# Patient Record
Sex: Male | Born: 1980 | Race: White | Hispanic: No | Marital: Married | Smoking: Never smoker
Health system: Southern US, Community
[De-identification: ages and names within clinical notes are randomized; demographics above are authoritative.]

## PROBLEM LIST (undated history)

## (undated) HISTORY — PX: WISDOM TOOTH EXTRACTION: SHX21

---

## 2013-02-08 ENCOUNTER — Ambulatory Visit (INDEPENDENT_AMBULATORY_CARE_PROVIDER_SITE_OTHER): Payer: Federal, State, Local not specified - PPO | Admitting: Family Medicine

## 2013-02-08 DIAGNOSIS — Z23 Encounter for immunization: Secondary | ICD-10-CM

## 2014-12-10 ENCOUNTER — Encounter (HOSPITAL_COMMUNITY): Payer: Self-pay | Admitting: Emergency Medicine

## 2014-12-10 ENCOUNTER — Observation Stay (HOSPITAL_COMMUNITY): Payer: Managed Care, Other (non HMO) | Admitting: Anesthesiology

## 2014-12-10 ENCOUNTER — Observation Stay (HOSPITAL_COMMUNITY)
Admission: EM | Admit: 2014-12-10 | Discharge: 2014-12-11 | Disposition: A | Discharge status: Home / Self Care | Payer: Managed Care, Other (non HMO) | Attending: General Surgery | Admitting: General Surgery

## 2014-12-10 ENCOUNTER — Encounter (HOSPITAL_COMMUNITY)
Admission: EM | Disposition: A | Discharge status: Home / Self Care | Payer: Self-pay | Source: Home / Self Care | Attending: Emergency Medicine

## 2014-12-10 ENCOUNTER — Emergency Department (HOSPITAL_COMMUNITY): Payer: Managed Care, Other (non HMO)

## 2014-12-10 DIAGNOSIS — K3580 Unspecified acute appendicitis: Principal | ICD-10-CM | POA: Insufficient documentation

## 2014-12-10 DIAGNOSIS — R1031 Right lower quadrant pain: Secondary | ICD-10-CM | POA: Diagnosis present

## 2014-12-10 DIAGNOSIS — K37 Unspecified appendicitis: Secondary | ICD-10-CM | POA: Diagnosis present

## 2014-12-10 HISTORY — PX: LAPAROSCOPIC APPENDECTOMY: SHX408

## 2014-12-10 LAB — URINALYSIS, ROUTINE W REFLEX MICROSCOPIC
Bilirubin Urine: NEGATIVE
GLUCOSE, UA: NEGATIVE mg/dL
Hgb urine dipstick: NEGATIVE
Ketones, ur: NEGATIVE mg/dL
LEUKOCYTES UA: NEGATIVE
Nitrite: NEGATIVE
PH: 6.5 (ref 5.0–8.0)
Protein, ur: NEGATIVE mg/dL
Specific Gravity, Urine: 1.02 (ref 1.005–1.030)
Urobilinogen, UA: 0.2 mg/dL (ref 0.0–1.0)

## 2014-12-10 LAB — COMPREHENSIVE METABOLIC PANEL
ALT: 38 U/L (ref 17–63)
ANION GAP: 13 (ref 5–15)
AST: 33 U/L (ref 15–41)
Albumin: 4.8 g/dL (ref 3.5–5.0)
Alkaline Phosphatase: 56 U/L (ref 38–126)
BUN: 16 mg/dL (ref 6–20)
CHLORIDE: 101 mmol/L (ref 101–111)
CO2: 24 mmol/L (ref 22–32)
Calcium: 9.8 mg/dL (ref 8.9–10.3)
Creatinine, Ser: 0.92 mg/dL (ref 0.61–1.24)
GFR calc non Af Amer: >60 mL/min (ref >60)
Glucose, Bld: 141 mg/dL — ABNORMAL HIGH (ref 65–99)
Potassium: 3.2 mmol/L — ABNORMAL LOW (ref 3.5–5.1)
SODIUM: 138 mmol/L (ref 135–145)
Total Bilirubin: 0.9 mg/dL (ref 0.3–1.2)
Total Protein: 7.9 g/dL (ref 6.5–8.1)

## 2014-12-10 LAB — CBC
HCT: 43.3 % (ref 39.0–52.0)
Hemoglobin: 15 g/dL (ref 13.0–17.0)
MCH: 30.3 pg (ref 26.0–34.0)
MCHC: 34.6 g/dL (ref 30.0–36.0)
MCV: 87.5 fL (ref 78.0–100.0)
Platelets: 217 K/uL (ref 150–400)
RBC: 4.95 MIL/uL (ref 4.22–5.81)
RDW: 12.1 % (ref 11.5–15.5)
WBC: 13.2 K/uL — ABNORMAL HIGH (ref 4.0–10.5)

## 2014-12-10 LAB — LIPASE, BLOOD: LIPASE: 18 U/L — AB (ref 22–51)

## 2014-12-10 SURGERY — APPENDECTOMY, LAPAROSCOPIC
Anesthesia: General | Site: Abdomen

## 2014-12-10 MED ORDER — MORPHINE SULFATE (PF) 4 MG/ML IV SOLN
4.0000 mg | Freq: Once | INTRAVENOUS | Status: AC
  Administered 2014-12-10: 4 mg via INTRAVENOUS
  Filled 2014-12-10: qty 1

## 2014-12-10 MED ORDER — ONDANSETRON HCL 4 MG/2ML IJ SOLN
INTRAMUSCULAR | Status: AC
  Filled 2014-12-10: qty 2

## 2014-12-10 MED ORDER — POTASSIUM CHLORIDE IN NACL 20-0.9 MEQ/L-% IV SOLN
INTRAVENOUS | Status: DC
  Administered 2014-12-10: 150 mL/h via INTRAVENOUS
  Filled 2014-12-10 (×3): qty 1000

## 2014-12-10 MED ORDER — IOHEXOL 300 MG/ML  SOLN
100.0000 mL | Freq: Once | INTRAMUSCULAR | Status: AC | PRN
  Administered 2014-12-10: 100 mL via INTRAVENOUS

## 2014-12-10 MED ORDER — HYDROCODONE-ACETAMINOPHEN 5-325 MG PO TABS
1.0000 | ORAL_TABLET | ORAL | Status: DC | PRN
  Administered 2014-12-10: 1 via ORAL
  Filled 2014-12-10: qty 1

## 2014-12-10 MED ORDER — FENTANYL CITRATE (PF) 100 MCG/2ML IJ SOLN
INTRAMUSCULAR | Status: DC | PRN
  Administered 2014-12-10: 100 ug via INTRAVENOUS
  Administered 2014-12-10 (×3): 50 ug via INTRAVENOUS

## 2014-12-10 MED ORDER — SODIUM CHLORIDE 0.9 % IV SOLN: INTRAVENOUS | Status: DC

## 2014-12-10 MED ORDER — FENTANYL CITRATE (PF) 100 MCG/2ML IJ SOLN: 25.0000 ug | INTRAMUSCULAR | Status: DC | PRN

## 2014-12-10 MED ORDER — DIPHENHYDRAMINE HCL 50 MG/ML IJ SOLN: 25.0000 mg | Freq: Four times a day (QID) | INTRAMUSCULAR | Status: DC | PRN

## 2014-12-10 MED ORDER — PROPOFOL 10 MG/ML IV BOLUS
INTRAVENOUS | Status: AC
  Filled 2014-12-10: qty 20

## 2014-12-10 MED ORDER — IOHEXOL 300 MG/ML  SOLN
50.0000 mL | Freq: Once | INTRAMUSCULAR | Status: AC | PRN
  Administered 2014-12-10: 50 mL via ORAL

## 2014-12-10 MED ORDER — METRONIDAZOLE IN NACL 5-0.79 MG/ML-% IV SOLN
500.0000 mg | Freq: Three times a day (TID) | INTRAVENOUS | Status: DC
  Administered 2014-12-10 – 2014-12-11 (×3): 500 mg via INTRAVENOUS
  Filled 2014-12-10 (×4): qty 100

## 2014-12-10 MED ORDER — ROCURONIUM BROMIDE 100 MG/10ML IV SOLN
INTRAVENOUS | Status: AC
  Filled 2014-12-10: qty 1

## 2014-12-10 MED ORDER — DIPHENHYDRAMINE HCL 25 MG PO CAPS
25.0000 mg | ORAL_CAPSULE | Freq: Four times a day (QID) | ORAL | Status: DC | PRN
Start: 2014-12-10 — End: 2014-12-11

## 2014-12-10 MED ORDER — PROMETHAZINE HCL 25 MG/ML IJ SOLN
6.2500 mg | INTRAMUSCULAR | Status: AC | PRN
  Administered 2014-12-10 (×2): 6.25 mg via INTRAVENOUS

## 2014-12-10 MED ORDER — ONDANSETRON HCL 4 MG/2ML IJ SOLN: 4.0000 mg | Freq: Once | INTRAMUSCULAR | Status: DC | PRN

## 2014-12-10 MED ORDER — BUPIVACAINE-EPINEPHRINE (PF) 0.5% -1:200000 IJ SOLN
INTRAMUSCULAR | Status: AC
  Filled 2014-12-10: qty 30

## 2014-12-10 MED ORDER — MIDAZOLAM HCL 2 MG/2ML IJ SOLN
INTRAMUSCULAR | Status: AC
  Filled 2014-12-10: qty 2

## 2014-12-10 MED ORDER — MORPHINE SULFATE (PF) 2 MG/ML IV SOLN: 1.0000 mg | INTRAVENOUS | Status: DC | PRN

## 2014-12-10 MED ORDER — PROPOFOL 10 MG/ML IV BOLUS
INTRAVENOUS | Status: DC | PRN
  Administered 2014-12-10: 200 mg via INTRAVENOUS

## 2014-12-10 MED ORDER — HYDRALAZINE HCL 20 MG/ML IJ SOLN: 10.0000 mg | INTRAMUSCULAR | Status: DC | PRN

## 2014-12-10 MED ORDER — DEXTROSE 5 % IV SOLN
2.0000 g | INTRAVENOUS | Status: DC
  Administered 2014-12-10 – 2014-12-11 (×2): 2 g via INTRAVENOUS
  Filled 2014-12-10 (×2): qty 2

## 2014-12-10 MED ORDER — DEXAMETHASONE SODIUM PHOSPHATE 10 MG/ML IJ SOLN
INTRAMUSCULAR | Status: DC | PRN
  Administered 2014-12-10: 10 mg via INTRAVENOUS

## 2014-12-10 MED ORDER — HYDROCODONE-ACETAMINOPHEN 5-325 MG PO TABS: 1.0000 | ORAL_TABLET | ORAL | Status: DC | PRN

## 2014-12-10 MED ORDER — LACTATED RINGERS IV SOLN
INTRAVENOUS | Status: DC
  Administered 2014-12-10: 1000 mL via INTRAVENOUS
  Administered 2014-12-10: 15:00:00 via INTRAVENOUS

## 2014-12-10 MED ORDER — ONDANSETRON 4 MG PO TBDP: 4.0000 mg | ORAL_TABLET | Freq: Four times a day (QID) | ORAL | Status: DC | PRN

## 2014-12-10 MED ORDER — DOCUSATE SODIUM 100 MG PO CAPS: 100.0000 mg | ORAL_CAPSULE | Freq: Two times a day (BID) | ORAL | Status: DC | PRN

## 2014-12-10 MED ORDER — ONDANSETRON HCL 4 MG/2ML IJ SOLN: 4.0000 mg | Freq: Four times a day (QID) | INTRAMUSCULAR | Status: DC | PRN

## 2014-12-10 MED ORDER — LACTATED RINGERS IR SOLN
Status: DC | PRN
  Administered 2014-12-10: 1

## 2014-12-10 MED ORDER — FENTANYL CITRATE (PF) 250 MCG/5ML IJ SOLN
INTRAMUSCULAR | Status: AC
  Filled 2014-12-10: qty 25

## 2014-12-10 MED ORDER — 0.9 % SODIUM CHLORIDE (POUR BTL) OPTIME
TOPICAL | Status: DC | PRN
  Administered 2014-12-10: 1000 mL

## 2014-12-10 MED ORDER — MIDAZOLAM HCL 5 MG/5ML IJ SOLN
INTRAMUSCULAR | Status: DC | PRN
  Administered 2014-12-10: 2 mg via INTRAVENOUS

## 2014-12-10 MED ORDER — ONDANSETRON HCL 4 MG/2ML IJ SOLN
4.0000 mg | Freq: Once | INTRAMUSCULAR | Status: AC
  Administered 2014-12-10: 4 mg via INTRAVENOUS
  Filled 2014-12-10: qty 2

## 2014-12-10 MED ORDER — MORPHINE SULFATE (PF) 2 MG/ML IV SOLN
1.0000 mg | INTRAVENOUS | Status: DC | PRN
  Administered 2014-12-10: 4 mg via INTRAVENOUS
  Filled 2014-12-10: qty 2

## 2014-12-10 MED ORDER — ROCURONIUM BROMIDE 100 MG/10ML IV SOLN
INTRAVENOUS | Status: DC | PRN
  Administered 2014-12-10: 30 mg via INTRAVENOUS

## 2014-12-10 MED ORDER — SODIUM CHLORIDE 0.9 % IJ SOLN
INTRAMUSCULAR | Status: AC
  Filled 2014-12-10: qty 10

## 2014-12-10 MED ORDER — SIMETHICONE 80 MG PO CHEW
40.0000 mg | CHEWABLE_TABLET | Freq: Four times a day (QID) | ORAL | Status: DC | PRN
  Filled 2014-12-10: qty 1

## 2014-12-10 MED ORDER — FAMOTIDINE 10 MG PO TABS
10.0000 mg | ORAL_TABLET | Freq: Once | ORAL | Status: AC
  Administered 2014-12-10: 10 mg via ORAL
  Filled 2014-12-10: qty 1

## 2014-12-10 MED ORDER — BUPIVACAINE-EPINEPHRINE 0.5% -1:200000 IJ SOLN
INTRAMUSCULAR | Status: DC | PRN
  Administered 2014-12-10: 21 mL

## 2014-12-10 MED ORDER — POTASSIUM CHLORIDE IN NACL 20-0.9 MEQ/L-% IV SOLN
INTRAVENOUS | Status: DC
  Administered 2014-12-10: 18:00:00 via INTRAVENOUS
  Filled 2014-12-10 (×3): qty 1000

## 2014-12-10 MED ORDER — ENOXAPARIN SODIUM 40 MG/0.4ML ~~LOC~~ SOLN
40.0000 mg | SUBCUTANEOUS | Status: DC
  Filled 2014-12-10 (×2): qty 0.4

## 2014-12-10 MED ORDER — ACETAMINOPHEN 325 MG PO TABS: 650.0000 mg | ORAL_TABLET | Freq: Four times a day (QID) | ORAL | Status: DC | PRN

## 2014-12-10 MED ORDER — GLYCOPYRROLATE 0.2 MG/ML IJ SOLN
INTRAMUSCULAR | Status: DC | PRN
  Administered 2014-12-10: 0.4 mg via INTRAVENOUS

## 2014-12-10 MED ORDER — LIDOCAINE HCL (CARDIAC) 20 MG/ML IV SOLN
INTRAVENOUS | Status: DC | PRN
  Administered 2014-12-10: 100 mg via INTRAVENOUS

## 2014-12-10 MED ORDER — LIDOCAINE HCL (CARDIAC) 20 MG/ML IV SOLN
INTRAVENOUS | Status: AC
  Filled 2014-12-10: qty 5

## 2014-12-10 MED ORDER — NEOSTIGMINE METHYLSULFATE 10 MG/10ML IV SOLN
INTRAVENOUS | Status: DC | PRN
  Administered 2014-12-10: 3.5 mg via INTRAVENOUS

## 2014-12-10 MED ORDER — GLYCOPYRROLATE 0.2 MG/ML IJ SOLN
INTRAMUSCULAR | Status: AC
  Filled 2014-12-10: qty 2

## 2014-12-10 MED ORDER — ONDANSETRON HCL 4 MG/2ML IJ SOLN
INTRAMUSCULAR | Status: DC | PRN
  Administered 2014-12-10: 4 mg via INTRAVENOUS

## 2014-12-10 MED ORDER — ACETAMINOPHEN 650 MG RE SUPP: 650.0000 mg | Freq: Four times a day (QID) | RECTAL | Status: DC | PRN

## 2014-12-10 MED ORDER — SODIUM CHLORIDE 0.9 % IV BOLUS (SEPSIS)
1000.0000 mL | Freq: Once | INTRAVENOUS | Status: AC
  Administered 2014-12-10: 1000 mL via INTRAVENOUS

## 2014-12-10 MED ORDER — PROMETHAZINE HCL 25 MG/ML IJ SOLN
INTRAMUSCULAR | Status: AC
  Administered 2014-12-10: 6.25 mg via INTRAVENOUS
  Filled 2014-12-10: qty 1

## 2014-12-10 MED ORDER — SUCCINYLCHOLINE CHLORIDE 20 MG/ML IJ SOLN
INTRAMUSCULAR | Status: DC | PRN
  Administered 2014-12-10: 100 mg via INTRAVENOUS

## 2014-12-10 SURGICAL SUPPLY — 42 items
APPLIER CLIP ROT 10 11.4 M/L (STAPLE)
APR CLP MED LRG 11.4X10 (STAPLE)
BAG SPEC RTRVL LRG 6X4 10 (ENDOMECHANICALS) ×1
CABLE HIGH FREQUENCY MONO STRZ (ELECTRODE) IMPLANT
CHLORAPREP W/TINT 26ML (MISCELLANEOUS) ×3 IMPLANT
CLIP APPLIE ROT 10 11.4 M/L (STAPLE) IMPLANT
CLOSURE WOUND 1/2 X4 (GAUZE/BANDAGES/DRESSINGS)
COVER SURGICAL LIGHT HANDLE (MISCELLANEOUS) ×3 IMPLANT
CUTTER FLEX LINEAR 45M (STAPLE) ×2 IMPLANT
DECANTER SPIKE VIAL GLASS SM (MISCELLANEOUS) ×3 IMPLANT
DEVICE TROCAR PUNCTURE CLOSURE (ENDOMECHANICALS) IMPLANT
DRAPE LAPAROSCOPIC ABDOMINAL (DRAPES) ×3 IMPLANT
ELECT REM PT RETURN 9FT ADLT (ELECTROSURGICAL) ×3
ELECTRODE REM PT RTRN 9FT ADLT (ELECTROSURGICAL) ×1 IMPLANT
ENDOLOOP SUT PDS II  0 18 (SUTURE)
ENDOLOOP SUT PDS II 0 18 (SUTURE) IMPLANT
GAUZE SPONGE 2X2 8PLY STRL LF (GAUZE/BANDAGES/DRESSINGS) ×1 IMPLANT
GLOVE EUDERMIC 7 POWDERFREE (GLOVE) ×3 IMPLANT
GOWN STRL REUS W/TWL XL LVL3 (GOWN DISPOSABLE) ×6 IMPLANT
KIT BASIN OR (CUSTOM PROCEDURE TRAY) ×3 IMPLANT
LIQUID BAND (GAUZE/BANDAGES/DRESSINGS) ×3 IMPLANT
POUCH SPECIMEN RETRIEVAL 10MM (ENDOMECHANICALS) ×3 IMPLANT
RELOAD 45 VASCULAR/THIN (ENDOMECHANICALS) ×3 IMPLANT
RELOAD STAPLE 45 2.5 WHT GRN (ENDOMECHANICALS) IMPLANT
RELOAD STAPLE 45 3.5 BLU ETS (ENDOMECHANICALS) IMPLANT
RELOAD STAPLE TA45 3.5 REG BLU (ENDOMECHANICALS) IMPLANT
SET IRRIG TUBING LAPAROSCOPIC (IRRIGATION / IRRIGATOR) ×3 IMPLANT
SHEARS HARMONIC ACE PLUS 36CM (ENDOMECHANICALS) IMPLANT
SOLUTION ANTI FOG 6CC (MISCELLANEOUS) ×3 IMPLANT
SPONGE GAUZE 2X2 STER 10/PKG (GAUZE/BANDAGES/DRESSINGS) ×2
STRIP CLOSURE SKIN 1/2X4 (GAUZE/BANDAGES/DRESSINGS) IMPLANT
SUT MNCRL AB 4-0 PS2 18 (SUTURE) ×3 IMPLANT
SUT VICRYL 0 UR6 27IN ABS (SUTURE) IMPLANT
TOWEL OR 17X26 10 PK STRL BLUE (TOWEL DISPOSABLE) ×3 IMPLANT
TOWEL OR NON WOVEN STRL DISP B (DISPOSABLE) ×3 IMPLANT
TRAY FOLEY W/METER SILVER 14FR (SET/KITS/TRAYS/PACK) ×3 IMPLANT
TRAY FOLEY W/METER SILVER 16FR (SET/KITS/TRAYS/PACK) ×3 IMPLANT
TRAY LAPAROSCOPIC (CUSTOM PROCEDURE TRAY) ×3 IMPLANT
TROCAR BLADELESS OPT 5 100 (ENDOMECHANICALS) ×3 IMPLANT
TROCAR XCEL 12X100 BLDLESS (ENDOMECHANICALS) ×3 IMPLANT
TROCAR XCEL BLUNT TIP 100MML (ENDOMECHANICALS) ×3 IMPLANT
TUBING INSUFFLATION 10FT LAP (TUBING) ×3 IMPLANT

## 2014-12-10 NOTE — ED Provider Notes (Signed)
CSN: [id number]     Arrival date & time 12/10/14  0147 History  This chart was scribed for Loren Racer, MD by Freida Busman, ED Scribe. This patient was seen in room WA18/WA18 and the patient's care was started 2:05 AM.    Chief Complaint  Patient presents with  . Abdominal Pain  . Nausea   The history is provided by the patient. No language interpreter was used.     HPI Comments:  Johnny Stephenson is a 34 y.o. male who presents to the Emergency Department complaining of gradual onset  mid to RLQ abdominal pain that started ~1900 last night.  He notes the pain worsened ~ 0100 this am. He reports associated nausea and chills. He denies diarrhea, vomiting, fever, dysuria, hematuria and h/o abdominal surgeries. No alleviating factors noted.  Pt last drank ~0100 this am.    History reviewed. No pertinent past medical history. Past Surgical History  Procedure Laterality Date  . Wisdom tooth extraction     History reviewed. No pertinent family history. Social History  Substance Use Topics  . Smoking status: Never Smoker   . Smokeless tobacco: None  . Alcohol Use: 0.0 oz/week    0 Standard drinks or equivalent per week     Comment: occassionally    Review of Systems  Constitutional: Positive for chills. Negative for fever.  Respiratory: Negative for shortness of breath.   Cardiovascular: Negative for chest pain.  Gastrointestinal: Positive for nausea and abdominal pain. Negative for vomiting and diarrhea.  Genitourinary: Negative for dysuria, hematuria and flank pain.  Musculoskeletal: Negative for back pain, neck pain and neck stiffness.  Skin: Negative for rash and wound.  Neurological: Negative for dizziness, weakness, light-headedness, numbness and headaches.  All other systems reviewed and are negative.     Allergies  Review of patient's allergies indicates no known allergies.  Home Medications   Prior to Admission medications   Medication Sig Start Date End  Date Taking? Authorizing Provider  Coenzyme Q10 (COQ10 PO) Take 1 capsule by mouth daily.   Yes Historical Provider, MD  famotidine (PEPCID) 10 MG tablet Take 10 mg by mouth once.   Yes Historical Provider, MD  ibuprofen (ADVIL,MOTRIN) 200 MG tablet Take 200-600 mg by mouth every 6 (six) hours as needed (for pain).   Yes Historical Provider, MD  Multiple Vitamin (MULTIVITAMIN WITH MINERALS) TABS tablet Take 1 tablet by mouth daily.   Yes Historical Provider, MD  sodium-potassium bicarbonate (ALKA-SELTZER GOLD) TBEF dissolvable tablet Take 2 tablets by mouth daily as needed (for acid reflux).   Yes Historical Provider, MD   BP 124/80 mmHg  Pulse 100  Temp(Src) 97.8 F (36.6 C) (Oral)  Resp 16  Ht 5\' 8"  (1.727 m)  Wt 183 lb (83.008 kg)  BMI 27.83 kg/m2  SpO2 100% Physical Exam  Constitutional: He is oriented to person, place, and time. He appears well-developed and well-nourished. No distress.  HENT:  Head: Normocephalic and atraumatic.  Mouth/Throat: Oropharynx is clear and moist.  Eyes: EOM are normal. Pupils are equal, round, and reactive to light.  Neck: Normal range of motion. Neck supple.  Cardiovascular: Normal rate and regular rhythm.   Pulmonary/Chest: Effort normal and breath sounds normal. No respiratory distress. He has no wheezes. He has no rales.  Abdominal: Soft. Bowel sounds are normal. He exhibits no distension and no mass. There is tenderness (tenderness to palpation in the right lower quadrant. No definitive rebound or guarding.). There is no rebound and no guarding.  Musculoskeletal: Normal range of motion. He exhibits no edema or tenderness.  No CVA tenderness bilaterally.  Neurological: He is alert and oriented to person, place, and time.  Skin: Skin is warm and dry. No rash noted. No erythema.  Psychiatric: He has a normal mood and affect. His behavior is normal.  Nursing note and vitals reviewed.   ED Course  Procedures  DIAGNOSTIC STUDIES:  Oxygen  Saturation is 100% on RA, normal by my interpretation.    COORDINATION OF CARE:  2:09 AM Discussed treatment plan with pt at bedside and pt agreed to plan.  Labs Review Labs Reviewed  LIPASE, BLOOD - Abnormal; Notable for the following:    Lipase 18 (*)    All other components within normal limits  COMPREHENSIVE METABOLIC PANEL - Abnormal; Notable for the following:    Potassium 3.2 (*)    Glucose, Bld 141 (*)    All other components within normal limits  CBC - Abnormal; Notable for the following:    WBC 13.2 (*)    All other components within normal limits  URINALYSIS, ROUTINE W REFLEX MICROSCOPIC (NOT AT [facility]) - Abnormal; Notable for the following:    APPearance CLOUDY (*)    All other components within normal limits    Imaging Review Ct Abdomen Pelvis W Contrast  12/10/2014   CLINICAL DATA:  Right lower quadrant pain starting at 1900 hours last night, increasing this morning. Elevated white cell count.  EXAM: CT ABDOMEN AND PELVIS WITH CONTRAST  TECHNIQUE: Multidetector CT imaging of the abdomen and pelvis was performed using the standard protocol following bolus administration of intravenous contrast.  CONTRAST:  OMNIPAQUE IOHEXOL 300 MG/ML  SOLN  COMPARISON:  None.  FINDINGS: Lung bases are clear.  Focal fatty infiltration adjacent to the falciform ligament of the liver. Gallbladder, spleen, pancreas, adrenal glands, kidneys, abdominal aorta, inferior vena cava, and retroperitoneal lymph nodes are unremarkable. Stomach, small bowel, and colon are not abnormally distended. Stool fills the colon. No free air or free fluid in the abdomen. Abdominal wall musculature appears intact.  Pelvis: Bulbous appendiceal tip measuring 10.6 mm diameter. There is otherwise normal caliber to the appendix and there is gas in the appendiceal tip. This suggest no evidence of acute appendicitis. No inflammatory reaction around the appendix and no abscess demonstrated. Calcifications in the prostate  gland without prostate enlargement. Bladder wall is not thickened. No free or loculated pelvic fluid collections. No pelvic mass or lymphadenopathy. No destructive bone lesions.  IMPRESSION: No acute process demonstrated in the abdomen or pelvis. No evidence of significant appendicitis. Focal fatty infiltration of the liver.   Electronically Signed   By: Burman Nieves M.D.   On: 12/10/2014 03:57   I have personally reviewed and evaluated these images and lab results as part of my medical decision-making.   EKG Interpretation None      MDM   Final diagnoses:  None    I personally performed the services described in this documentation, which was scribed in my presence. The recorded information has been reviewed and is accurate.  Concern for early appendicitis. CT abdomen and pelvis. Patient will be kept NPO.   Patient's pain is improved. Still has mild tenderness in the right lower quadrant but continues to have no rebound or guarding. There is mild elevation of his white blood cell count. There is no evidence of appendicitis on CT. We'll hold in the emergency department for serial abdominal examinations. Next  Patient volunteers that he has  had ongoing episodic abdominal discomfort since he was a child.  1610: Patient is to have discomfort in the right lower quadrant but there is no rebound or guarding. Pain medicine redosed.  Discussed with general surgery. Will evaluate in the emergency department.   Loren Racer, MD 12/10/14 [phone]

## 2014-12-10 NOTE — ED Notes (Signed)
MD at bedside. 

## 2014-12-10 NOTE — Anesthesia Preprocedure Evaluation (Addendum)
Anesthesia Evaluation  Patient identified by MRN, date of birth, ID band Patient awake    Reviewed: Allergy & Precautions, NPO status , Patient's Chart, lab work & pertinent test results  History of Anesthesia Complications Negative for: history of anesthetic complications  Airway Mallampati: II  TM Distance: >3 FB Neck ROM: Full    Dental no notable dental hx. (+) Dental Advisory Given   Pulmonary neg pulmonary ROS,    Pulmonary exam normal breath sounds clear to auscultation       Cardiovascular Exercise Tolerance: Good negative cardio ROS Normal cardiovascular exam Rhythm:Regular Rate:Normal     Neuro/Psych negative neurological ROS  negative psych ROS   GI/Hepatic negative GI ROS, Neg liver ROS,   Endo/Other  negative endocrine ROS  Renal/GU negative Renal ROS  negative genitourinary   Musculoskeletal negative musculoskeletal ROS (+)   Abdominal   Peds negative pediatric ROS (+)  Hematology negative hematology ROS (+)   Anesthesia Other Findings   Reproductive/Obstetrics negative OB ROS                            Anesthesia Physical Anesthesia Plan  ASA: I and emergent  Anesthesia Plan: General   Post-op Pain Management:    Induction: Intravenous, Rapid sequence and Cricoid pressure planned  Airway Management Planned: Oral ETT  Additional Equipment:   Intra-op Plan:   Post-operative Plan: Extubation in OR  Informed Consent: I have reviewed the patients History and Physical, chart, labs and discussed the procedure including the risks, benefits and alternatives for the proposed anesthesia with the patient or authorized representative who has indicated his/her understanding and acceptance.   Dental advisory given  Plan Discussed with: CRNA  Anesthesia Plan Comments:         Anesthesia Quick Evaluation

## 2014-12-10 NOTE — H&P (Signed)
[facility] Surgery Admission Note  Johnny Stephenson [birth year]  [id number].    Requesting MD: Dr. Lita Mains Chief Complaint/Reason for Consult: RLQ abdominal pain/abnormal appendix  HPI:  34 y/o white male presents to [facility] with 6/57 sharp periumbilical pain which has now settled in the RLQ.  The pain started at 1900 last night as abdominal pain, nausea, trouble emptying his bladder.  His pain progressively worsened until 1am which prompted him to come to the ER.  He denies vomiting, diarrhea, chest pain, SOB, fever/chills.  No precipitating or alleviating factors.  No radiating pain or back pain.  He's never had this before, but mentions he's had a h/o food poisoning about 1 year ago after eating undercooked pork.  He's never had any surgery except for WTE.  No h/o hernias.     ROS: All systems reviewed and otherwise negative except for as above  History reviewed. No pertinent family history.  History reviewed. No pertinent past medical history.  History reviewed. No pertinent past surgical history.  Social History:  reports that he has never smoked. He does not have any smokeless tobacco history on file. He reports that he does not drink alcohol. His drug history is not on file.  Allergies: No Known Allergies   (Not in a hospital admission)  Blood pressure 124/80, pulse 100, temperature 97.8 F (36.6 C), temperature source Oral, resp. rate 16, height $RemoveBe'5\' 8"'JrnNEFyDY$  (1.727 m), weight 83.008 kg (183 lb), SpO2 100 %. Physical Exam: General: pleasant, WD/WN white male who is laying in bed in NAD HEENT: head is normocephalic, atraumatic.  Sclera are noninjected.  PERRL.  Ears and nose without any masses or lesions.  Mouth is pink and moist Heart: regular, rate, and rhythm.  No obvious murmurs, gallops, or rubs noted.  Palpable pedal pulses bilaterally Lungs: CTAB, no wheezes, rhonchi, or rales noted.  Respiratory effort nonlabored Abd: soft, mild distension, moderate tenderness in  the RLQ at McBurney's point and suprapubic area, +BS, no masses, hernias, or organomegaly, no scars noted MS: all 4 extremities are symmetrical with no cyanosis, clubbing, or edema. Skin: warm and dry with no masses, lesions, or rashes Psych: A&Ox3 with an appropriate affect.   Results for orders placed or performed during the hospital encounter of 12/10/14 (from the past 48 hour(s))  Lipase, blood     Status: Abnormal   Collection Time: 12/10/14  1:59 AM  Result Value Ref Range   Lipase 18 (L) 22 - 51 U/L  Comprehensive metabolic panel     Status: Abnormal   Collection Time: 12/10/14  1:59 AM  Result Value Ref Range   Sodium 138 135 - 145 mmol/L   Potassium 3.2 (L) 3.5 - 5.1 mmol/L   Chloride 101 101 - 111 mmol/L   CO2 24 22 - 32 mmol/L   Glucose, Bld 141 (H) 65 - 99 mg/dL   BUN 16 6 - 20 mg/dL   Creatinine, Ser 0.92 0.61 - 1.24 mg/dL   Calcium 9.8 8.9 - 10.3 mg/dL   Total Protein 7.9 6.5 - 8.1 g/dL   Albumin 4.8 3.5 - 5.0 g/dL   AST 33 15 - 41 U/L   ALT 38 17 - 63 U/L   Alkaline Phosphatase 56 38 - 126 U/L   Total Bilirubin 0.9 0.3 - 1.2 mg/dL   GFR calc non Af Amer >60 >60 mL/min   GFR calc Af Amer >60 >60 mL/min    Comment: (NOTE) The eGFR has been calculated using the CKD EPI  equation. This calculation has not been validated in all clinical situations. eGFR's persistently <60 mL/min signify possible Chronic Kidney Disease.    Anion gap 13 5 - 15  CBC     Status: Abnormal   Collection Time: 12/10/14  1:59 AM  Result Value Ref Range   WBC 13.2 (H) 4.0 - 10.5 K/uL   RBC 4.95 4.22 - 5.81 MIL/uL   Hemoglobin 15.0 13.0 - 17.0 g/dL   HCT 43.3 39.0 - 52.0 %   MCV 87.5 78.0 - 100.0 fL   MCH 30.3 26.0 - 34.0 pg   MCHC 34.6 30.0 - 36.0 g/dL   RDW 12.1 11.5 - 15.5 %   Platelets 217 150 - 400 K/uL  Urinalysis, Routine w reflex microscopic (not at [facility])     Status: Abnormal   Collection Time: 12/10/14  2:32 AM  Result Value Ref Range   Color, Urine YELLOW YELLOW    APPearance CLOUDY (A) CLEAR   Specific Gravity, Urine 1.020 1.005 - 1.030   pH 6.5 5.0 - 8.0   Glucose, UA NEGATIVE NEGATIVE mg/dL   Hgb urine dipstick NEGATIVE NEGATIVE   Bilirubin Urine NEGATIVE NEGATIVE   Ketones, ur NEGATIVE NEGATIVE mg/dL   Protein, ur NEGATIVE NEGATIVE mg/dL   Urobilinogen, UA 0.2 0.0 - 1.0 mg/dL   Nitrite NEGATIVE NEGATIVE   Leukocytes, UA NEGATIVE NEGATIVE    Comment: MICROSCOPIC NOT DONE ON URINES WITH NEGATIVE PROTEIN, BLOOD, LEUKOCYTES, NITRITE, OR GLUCOSE <1000 mg/dL.   Ct Abdomen Pelvis W Contrast  12/10/2014   CLINICAL DATA:  Right lower quadrant pain starting at 1900 hours last night, increasing this morning. Elevated white cell count.  EXAM: CT ABDOMEN AND PELVIS WITH CONTRAST  TECHNIQUE: Multidetector CT imaging of the abdomen and pelvis was performed using the standard protocol following bolus administration of intravenous contrast.  CONTRAST:  134mL OMNIPAQUE IOHEXOL 300 MG/ML  SOLN  COMPARISON:  None.  FINDINGS: Lung bases are clear.  Focal fatty infiltration adjacent to the falciform ligament of the liver. Gallbladder, spleen, pancreas, adrenal glands, kidneys, abdominal aorta, inferior vena cava, and retroperitoneal lymph nodes are unremarkable. Stomach, small bowel, and colon are not abnormally distended. Stool fills the colon. No free air or free fluid in the abdomen. Abdominal wall musculature appears intact.  Pelvis: Bulbous appendiceal tip measuring 10.6 mm diameter. There is otherwise normal caliber to the appendix and there is gas in the appendiceal tip. This suggest no evidence of acute appendicitis. No inflammatory reaction around the appendix and no abscess demonstrated. Calcifications in the prostate gland without prostate enlargement. Bladder wall is not thickened. No free or loculated pelvic fluid collections. No pelvic mass or lymphadenopathy. No destructive bone lesions.  IMPRESSION: No acute process demonstrated in the abdomen or pelvis. No evidence  of significant appendicitis. Focal fatty infiltration of the liver.   Electronically Signed   By: Lucienne Capers M.D.   On: 12/10/2014 03:57      Assessment/Plan Acute appendicitis Leukocytosis  Plan: 1.  Admit to [facility], urgent lap appy 2.  NPO, bowel rest, IVF, pain control, antiemetics, antibiotics (rocephin/flagyl) 3.  SCD's  4.  Ambulate and IS 5.  Dr. Dalbert Batman discussed the risks and benefits and operative and non-operative management in detail.  The patient and his wife would like to proceed with surgical interventions today.   6.  If all goes well he should be able to be discharged tomorrow.   Nat Christen, [facility] Surgery 12/10/2014, 7:41 AM Pager: [phone]

## 2014-12-10 NOTE — ED Notes (Signed)
Pt reports mid lower abdominal pain starting around 1900 last night. Pt is concerned he has appendicitis per Web MD. Denies diarrhea/emesis/fever/constipation but endorses nausea. No other c/c.

## 2014-12-10 NOTE — Anesthesia Postprocedure Evaluation (Signed)
  Anesthesia Post-op Note  Patient: Johnny Stephenson  Procedure(s) Performed: Procedure(s) (LRB): APPENDECTOMY LAPAROSCOPIC (N/A)  Patient Location: PACU  Anesthesia Type: General  Level of Consciousness: awake and alert   Airway and Oxygen Therapy: Patient Spontanous Breathing  Post-op Pain: mild  Post-op Assessment: Post-op Vital signs reviewed, Patient's Cardiovascular Status Stable, Respiratory Function Stable, Patent Airway and No signs of Nausea or vomiting  Last Vitals:  Filed Vitals:   12/10/14 1627  BP:   Pulse:   Temp: 36.8 C  Resp:     Post-op Vital Signs: stable   Complications: No apparent anesthesia complications

## 2014-12-10 NOTE — Op Note (Signed)
Patient Name:           Johnny Stephenson   Date of Surgery:        12/10/2014  Pre op Diagnosis:      Acute appendicitis  Post op Diagnosis:    Acute appendicitis  Procedure:                 Laparoscopic appendectomy  Surgeon:                     Angelia Mould. Derrell Lolling, M.D., FACS  Assistant:                      OR staff  Operative Indications:   This is a healthy 34 year old gentleman who presents with a one-day history of abdominal pain, initially centralized and now localized to the right lower quadrant with nausea.  Examination reveals tenderness that is maximally localized in the right lower quadrant.  Lab work reveals leukocytosis.  CT scan does not show any inflammatory process but the tip of the appendix is enlarged.  It was felt that his clinical presentation was consistent with acute appendicitis.  He is brought to the operating room urgently  Operative Findings:       The patient had acute appendicitis.  The distal 25% of the appendiceal tip was markedly enlarged with inflammatory exudate but no perforation.  The terminal ileum and cecum looked normal.  The right colon and liver looks normal.  The sigmoid colon looks normal.  Procedure in Detail:          Following the induction of general endotracheal anesthesia a Foley catheter was inserted and the abdomen was prepped and draped in a sterile fashion.  The patient was on intravenous antibiotics.  Surgical timeout was performed.  0.5% Marcaine with epinephrine was used as local infiltration anesthetic.     A vertical incision was made at the superior rim of the umbilicus.  The fascia was incised in the midline and the abdominal cavity entered under direct vision.  An 11 mm Hassan trocar was inserted and secured to the Purstring suture of 0 Vicryl.  Pneumoperitoneum was created.  Video camera was inserted.  A 12 mm trochar was placed in the suprapubic region and a 5 mm trocar in the left lower quadrant.  We identified the tip of the  appendix.  We had to divide its lateral peritoneal attachments to mobilize it.  We had to gently separate it from the mesentery of the terminal ileum.  We could then see the entirety of the appendix and its junction with the cecum.  We divided the appendiceal mesentery with the Harmonic scalpel.  We had one arterial bleeder in the appendiceal mesentery which was controlled with the harmonic scalpel.  At the end of the case it was completely hemostatic.  The appendix was transected at the level of the cecum with the Endo GIA stapling device, and the appendix was placed in a specimen bag and removed.  A few loose staples were retrieved.  Spent some time irrigating the operative field to make sure there was no bleeding and it looked very good.  The staple line looked good.  Pneumoperitoneum was released and the trochars were removed.  There was no bleeding internally from any of the trocar sites.  The fascia at the umbilicus was closed closed with 0 Vicryl sutures and the skins incisions closed with subcuticular sutures of 4-0 Monocryl and Dermabond.  Patient tolerated  the procedure well was taken to PACU in stable condition.  EBL 20 mL.  Counts correct.  Complications none.    Angelia Mould. Derrell Lolling, M.D., FACS General and Minimally Invasive Surgery Breast and Colorectal Surgery  12/10/2014 3:03 PM

## 2014-12-10 NOTE — Progress Notes (Signed)
34 yr old male [place] pt with RLQ abdominal pain, nausea and chills.  diagnostic laparoscopy and laparoscopic appendectomy per General surgery scheduled CM spoke with pt who confirms uninsured [employer] resident with no pcp.  CM discussed and provided written information for uninsured accepting pcps, discussed the importance of pcp vs EDP services for f/u care, www.needymeds.org, www.goodrx.com, discounted pharmacies and other [employer] such as [facility] , [employer], affordable care act, financial assistance, uninsured dental services, [place] med assist, DSS and  health department  Reviewed resources for [employer] uninsured accepting pcps like [facility], family medicine at [facility], [facility], [facility], local urgent care centers, [facility], [facility] family practice, general medical clinics, family services of the [place], [facility] urgent care plus others, medication resources, [facility] out patient pharmacies and housing Pt voiced understanding and appreciation of resources provided   Provided [facility] contact information Pt agreed to a referral Cm completed referral Pt to be contact by [facility] clinical liason

## 2014-12-10 NOTE — Anesthesia Procedure Notes (Signed)
Procedure Name: Intubation Date/Time: 12/10/2014 2:07 PM Performed by: Doran Clay Pre-anesthesia Checklist: Patient identified, Emergency Drugs available, Suction available, Patient being monitored and Timeout performed Patient Re-evaluated:Patient Re-evaluated prior to inductionOxygen Delivery Method: Circle system utilized Intubation Type: IV induction, Rapid sequence and Cricoid Pressure applied Laryngoscope Size: Mac and 4 Grade View: Grade I Tube type: Oral Tube size: 7.5 mm Number of attempts: 1 Airway Equipment and Method: Stylet Placement Confirmation: ETT inserted through vocal cords under direct vision,  positive ETCO2 and breath sounds checked- equal and bilateral Secured at: 22 cm Tube secured with: Tape Dental Injury: Teeth and Oropharynx as per pre-operative assessment

## 2014-12-10 NOTE — Transfer of Care (Signed)
Immediate Anesthesia Transfer of Care Note  Patient: Johnny Stephenson  Procedure(s) Performed: Procedure(s): APPENDECTOMY LAPAROSCOPIC (N/A)  Patient Location: PACU  Anesthesia Type:General  Level of Consciousness: awake, sedated and responds to stimulation  Airway & Oxygen Therapy: Patient Spontanous Breathing and Patient connected to face mask oxygen  Post-op Assessment: Report given to RN and Post -op Vital signs reviewed and stable  Post vital signs: Reviewed and stable  Last Vitals:  Filed Vitals:   12/10/14 0830  BP: 133/77  Pulse: 82  Temp:   Resp: 10    Complications: No apparent anesthesia complications

## 2014-12-10 NOTE — ED Notes (Signed)
Called to give report. Nurse is at lunch. Will call back.

## 2014-12-11 ENCOUNTER — Encounter (HOSPITAL_COMMUNITY): Payer: Self-pay | Admitting: General Surgery

## 2014-12-11 ENCOUNTER — Encounter: Payer: Self-pay | Admitting: General Surgery

## 2014-12-11 MED ORDER — HYDROCODONE-ACETAMINOPHEN 5-325 MG PO TABS: 1.0000 | ORAL_TABLET | ORAL | Status: DC | PRN

## 2014-12-11 NOTE — Discharge Summary (Signed)
Physician Discharge Summary  Patient ID: Johnny Stephenson MRN: [id number] DOB/AGE: March 19, 1981 34 y.o.  Admit date: 12/10/2014 Discharge date: 12/11/2014  Admission Diagnoses:  Acute appendicitis   Discharge Diagnoses:  Acute appendicitis Post op urinary retention  Principal Problem:   Appendicitis   PROCEDURES: laparoscopic appendectomy 12/10/14, Dr. Blanchie Serve Course:  33 y/o white male presents to [facility] with 8/10 sharp periumbilical pain which has now settled in the RLQ. The pain started at 1900 last night as abdominal pain, nausea, trouble emptying his bladder. His pain progressively worsened until 1am which prompted him to come to the ER. He denies vomiting, diarrhea, chest pain, SOB, fever/chills. No precipitating or alleviating factors. No radiating pain or back pain. He's never had this before, but mentions he's had a h/o food poisoning about 1 year ago after eating undercooked pork. He's never had any surgery except for WTE. No h/o hernias.  He was admitted by Dr. Derrell Lolling and taken to the OR later that day.  He has had a regular breakfast and walked post op.  He did have some urinary retention and a urinary cath was placed.  It has been removed and he is voiding well now.  He did have some discomfort after removal but that improves with each void.   He will follow up in the clinic as noted below.    Condition on D/C:  Improved    Disposition:  Home today     Medication List    STOP taking these medications        sodium-potassium bicarbonate Tbef dissolvable tablet  Commonly known as:  ALKA-SELTZER GOLD      TAKE these medications        COQ10 PO  Take 1 capsule by mouth daily.     famotidine 10 MG tablet  Commonly known as:  PEPCID  Take 10 mg by mouth once.     HYDROcodone-acetaminophen 5-325 MG per tablet  Commonly known as:  NORCO/VICODIN  Take 1-2 tablets by mouth every 4 (four) hours as needed for moderate pain.     ibuprofen 200 MG tablet   Commonly known as:  ADVIL,MOTRIN  Take 200-600 mg by mouth every 6 (six) hours as needed (for pain).     multivitamin with minerals Tabs tablet  Take 1 tablet by mouth daily.       Follow-up Information    Follow up with Please use resources provided in ED to assist with follow up with a family/primary provider as needed . Schedule an appointment as soon as possible for a visit on 12/12/2014.   Why:  [phone] Tuesday-Friday to check on the status of your referral   Contact information:   A referral has been sent also for you to [facility] (orange card) so that contact can be made to see if you are eligible for discounted/uninsured providers You will be called or sent a postcard If no response in 3-5 days- call      Follow up with [facility] [place] On 12/30/2014.   Why:  You should have an appointment at 3 PM on this date set up, but call later today or tomorrow to be sure it is confirmed.  Be at the office 30 minutes before that for check in.     Contact information:   [address] [place] [address] [phone]      Signed: Sherrie George 12/11/2014, 10:35 AM

## 2014-12-11 NOTE — Discharge Instructions (Signed)
Laparoscopic Appendectomy °Care After °Refer to this sheet in the next few weeks. These instructions provide you with information on caring for yourself after your procedure. Your caregiver may also give you more specific instructions. Your treatment has been planned according to current medical practices, but problems sometimes occur. Call your caregiver if you have any problems or questions after your procedure. °HOME CARE INSTRUCTIONS °· Do not drive while taking narcotic pain medicines. °· Use stool softener if you become constipated from your pain medicines. °· Change your bandages (dressings) as directed. °· Keep your wounds clean and dry. You may wash the wounds gently with soap and water. Gently pat the wounds dry with a clean towel. °· Do not take baths, swim, or use hot tubs for 10 days, or as instructed by your caregiver. °· Only take over-the-counter or prescription medicines for pain, discomfort, or fever as directed by your caregiver. °· You may continue your normal diet as directed. °· Do not lift more than 10 pounds (4.5 kg) or play contact sports for 3 weeks, or as directed. °· Slowly increase your activity after surgery. °· Take deep breaths to avoid getting a lung infection (pneumonia). °SEEK MEDICAL CARE IF: °· You have redness, swelling, or increasing pain in your wounds. °· You have pus coming from your wounds. °· You have drainage from a wound that lasts longer than 1 day. °· You notice a bad smell coming from the wounds or dressing. °· Your wound edges break open after stitches (sutures) have been removed. °· You notice increasing pain in the shoulders (shoulder strap areas) or near your shoulder blades. °· You develop dizzy episodes or fainting while standing. °· You develop shortness of breath. °· You develop persistent nausea or vomiting. °· You cannot control your bowel functions or lose your appetite. °· You develop diarrhea. °SEEK IMMEDIATE MEDICAL CARE IF:  °· You have a fever. °· You  develop a rash. °· You have difficulty breathing or sharp pains in your chest. °· You develop any reaction or side effects to medicines given. °MAKE SURE YOU: °· Understand these instructions. °· Will watch your condition. °· Will get help right away if you are not doing well or get worse. °Document Released: 03/21/2005 Document Revised: 06/13/2011 Document Reviewed: 09/28/2010 °ExitCare® Patient Information ©2015 [employer]. This information is not intended to replace advice given to you by your health care provider. Make sure you discuss any questions you have with your health care provider. °[facility] ______[facility] °LAPAROSCOPIC SURGERY: POST OP INSTRUCTIONS °Always review your discharge instruction sheet given to you by the facility where your surgery was performed. °IF YOU HAVE DISABILITY OR FAMILY LEAVE FORMS, YOU MUST BRING THEM TO THE OFFICE FOR PROCESSING.   °DO NOT GIVE THEM TO YOUR DOCTOR. ° °1. A prescription for pain medication may be given to you upon discharge.  Take your pain medication as prescribed, if needed.  If narcotic pain medicine is not needed, then you may take acetaminophen (Tylenol) or ibuprofen (Advil) as needed. °2. Take your usually prescribed medications unless otherwise directed. °3. If you need a refill on your pain medication, please contact your pharmacy.  They will contact our office to request authorization. Prescriptions will not be filled after 5pm or on week-ends. °4. You should follow a light diet the first few days after arrival home, such as soup and crackers, etc.  Be sure to include lots of fluids daily. °5. Most patients will experience some swelling and bruising   in the area of the incisions.  Ice packs will help.  Swelling and bruising can take several days to resolve.  °6. It is common to experience some constipation if taking pain medication after surgery.  Increasing fluid intake and taking a stool softener (such as Colace) will usually help or  prevent this problem from occurring.  A mild laxative (Milk of Magnesia or Miralax) should be taken according to package instructions if there are no bowel movements after 48 hours. °7. Unless discharge instructions indicate otherwise, you may remove your bandages 24-48 hours after surgery, and you may shower at that time.  You may have steri-strips (small skin tapes) in place directly over the incision.  These strips should be left on the skin for 7-10 days.  If your surgeon used skin glue on the incision, you may shower in 24 hours.  The glue will flake off over the next 2-3 weeks.  Any sutures or staples will be removed at the office during your follow-up visit. °8. ACTIVITIES:  You may resume regular (light) daily activities beginning the next day--such as daily self-care, walking, climbing stairs--gradually increasing activities as tolerated.  You may have sexual intercourse when it is comfortable.  Refrain from any heavy lifting or straining until approved by your doctor. °a. You may drive when you are no longer taking prescription pain medication, you can comfortably wear a seatbelt, and you can safely maneuver your car and apply brakes. °b. RETURN TO WORK:  __________________________________________________________ °9. You should see your doctor in the office for a follow-up appointment approximately 2-3 weeks after your surgery.  Make sure that you call for this appointment within a day or two after you arrive home to insure a convenient appointment time. °10. OTHER INSTRUCTIONS: __________________________________________________________________________________________________________________________ __________________________________________________________________________________________________________________________ °WHEN TO CALL YOUR DOCTOR: °1. Fever over 101.0 °2. Inability to urinate °3. Continued bleeding from incision. °4. Increased pain, redness, or drainage from the incision. °5. Increasing  abdominal pain ° °The clinic staff is available to answer your questions during regular business hours.  Please don’t hesitate to call and ask to speak to one of the nurses for clinical concerns.  If you have a medical emergency, go to the nearest emergency room or call 911.  A surgeon from [facility] is always on call at the hospital. °[address], [address], [place], [place]  [address] ? [address] [address], [place], [place]   [address] °[phone] ? [phone] ? FAX [phone] °Web site: [email or web address] ° °

## 2014-12-11 NOTE — Progress Notes (Signed)
1 Day Post-Op  Subjective: He is sore but doing well.  Tolerated breakfast and has been up walking.  Had some trouble voiding,foley placed last PM, now out and voiding better.  Objective: Vital signs in last 24 hours: Temp:  [97.6 F (36.4 C)-99 F (37.2 C)] 98.4 F (36.9 C) (09/08 0607) Pulse Rate:  [63-77] 71 (09/08 0607) Resp:  [8-18] 16 (09/08 0607) BP: (127-148)/(58-92) 131/74 mmHg (09/08 0607) SpO2:  [97 %-100 %] 98 % (09/08 0607) Weight:  [86.2 kg (190 lb 0.6 oz)] 86.2 kg (190 lb 0.6 oz) (09/07 1652)  720 PO this AM so far Afebrile VSS No labs Regular diet Intake/Output from previous day: 09/07 0701 - 09/08 0700 In: 2960 [P.O.:250; I.V.:2510; IV Piggyback:200] Out: 950 [Urine:950] Intake/Output this shift: Total I/O In: 720 [P.O.:720] Out: -   General appearance: alert, cooperative and no distress GI: sore and tender, sites all look good.  Lab Results:   Recent Labs  12/10/14 0159  WBC 13.2*  HGB 15.0  HCT 43.3  PLT 217    BMET  Recent Labs  12/10/14 0159  NA 138  K 3.2*  CL 101  CO2 24  GLUCOSE 141*  BUN 16  CREATININE 0.92  CALCIUM 9.8   PT/INR No results for input(s): LABPROT, INR in the last 72 hours.   Recent Labs Lab 12/10/14 0159  AST 33  ALT 38  ALKPHOS 56  BILITOT 0.9  PROT 7.9  ALBUMIN 4.8     Lipase     Component Value Date/Time   LIPASE 18* 12/10/2014 0159     Studies/Results: Ct Abdomen Pelvis W Contrast  12/10/2014   CLINICAL DATA:  Right lower quadrant pain starting at 1900 hours last night, increasing this morning. Elevated white cell count.  EXAM: CT ABDOMEN AND PELVIS WITH CONTRAST  TECHNIQUE: Multidetector CT imaging of the abdomen and pelvis was performed using the standard protocol following bolus administration of intravenous contrast.  CONTRAST:  OMNIPAQUE IOHEXOL 300 MG/ML  SOLN  COMPARISON:  None.  FINDINGS: Lung bases are clear.  Focal fatty infiltration adjacent to the falciform ligament of the  liver. Gallbladder, spleen, pancreas, adrenal glands, kidneys, abdominal aorta, inferior vena cava, and retroperitoneal lymph nodes are unremarkable. Stomach, small bowel, and colon are not abnormally distended. Stool fills the colon. No free air or free fluid in the abdomen. Abdominal wall musculature appears intact.  Pelvis: Bulbous appendiceal tip measuring 10.6 mm diameter. There is otherwise normal caliber to the appendix and there is gas in the appendiceal tip. This suggest no evidence of acute appendicitis. No inflammatory reaction around the appendix and no abscess demonstrated. Calcifications in the prostate gland without prostate enlargement. Bladder wall is not thickened. No free or loculated pelvic fluid collections. No pelvic mass or lymphadenopathy. No destructive bone lesions.  IMPRESSION: No acute process demonstrated in the abdomen or pelvis. No evidence of significant appendicitis. Focal fatty infiltration of the liver.   Electronically Signed   By: Burman Nieves M.D.   On: 12/10/2014 03:57    Medications: . cefTRIAXone (ROCEPHIN)  IV  2 g Intravenous Q24H   And  . metronidazole  500 mg Intravenous Q8H  . enoxaparin (LOVENOX) injection  40 mg Subcutaneous Q24H    Assessment/Plan Acute appendictis S/p laparoscopic appendectomy 12/10/14, Dr Dagoberto Reef op urinary retention Antibiotics:  Ceftriaxone/Flagyl day 2 DVT: Lovenox/SCD  Plan:  Home today follow up in the clinic 3 weeks.          Sherrie George  12/11/2014   

## 2015-01-12 ENCOUNTER — Ambulatory Visit (INDEPENDENT_AMBULATORY_CARE_PROVIDER_SITE_OTHER): Payer: Managed Care, Other (non HMO)

## 2015-01-12 DIAGNOSIS — Z23 Encounter for immunization: Secondary | ICD-10-CM | POA: Diagnosis not present

## 2015-07-30 ENCOUNTER — Ambulatory Visit (INDEPENDENT_AMBULATORY_CARE_PROVIDER_SITE_OTHER): Payer: Federal, State, Local not specified - PPO | Admitting: Family Medicine

## 2015-07-30 VITALS — BP 138/80 | HR 77 | Temp 97.5°F | Resp 16 | Ht 68.0 in | Wt 183.0 lb

## 2015-07-30 DIAGNOSIS — Z23 Encounter for immunization: Secondary | ICD-10-CM

## 2015-07-30 NOTE — Patient Instructions (Addendum)
 Td Vaccine (Tetanus and Diphtheria): What You Need to Know 1. Why get vaccinated? Tetanus  and diphtheria are very serious diseases. They are rare in the [place] today, but people who do become infected often have severe complications. Td vaccine is used to protect adolescents and adults from both of these diseases. Both tetanus and diphtheria are infections caused by bacteria. Diphtheria spreads from person to person through coughing or sneezing. Tetanus-causing bacteria enter the body through cuts, scratches, or wounds. TETANUS (Lockjaw) causes painful muscle tightening and stiffness, usually all over the body.  It can lead to tightening of muscles in the head and neck so you can't open your mouth, swallow, or sometimes even breathe. Tetanus kills about 1 out of every 10 people who are infected even after receiving the best medical care. DIPHTHERIA can cause a thick coating to form in the back of the throat.  It can lead to breathing problems, paralysis, heart failure, and death. Before vaccines, as many as 200,000 cases of diphtheria and hundreds of cases of tetanus were reported in the [place] each year. Since vaccination began, reports of cases for both diseases have dropped by about 99%. 2. Td vaccine Td vaccine can protect adolescents and adults from tetanus and diphtheria. Td is usually given as a booster dose every 10 years but it can also be given earlier after a severe and dirty wound or burn. Another vaccine, called Tdap, which protects against pertussis in addition to tetanus and diphtheria, is sometimes recommended instead of Td vaccine. Your doctor or the person giving you the vaccine can give you more information. Td may safely be given at the same time as other vaccines. 3. Some people should not get this vaccine  A person who has ever had a life-threatening allergic reaction after a previous dose of any tetanus or diphtheria containing vaccine, OR has a severe  allergy to any part of this vaccine, should not get Td vaccine. Tell the person giving the vaccine about any severe allergies.  Talk to your doctor if you:  have seizures or another nervous system problem,  had severe pain or swelling after any vaccine containing diphtheria or tetanus,  ever had a condition called Guillain Barre Syndrome (GBS),  aren't feeling well on the day the shot is scheduled. 4. Risks of a vaccine reaction With any medicine, including vaccines, there is a chance of side effects. These are usually mild and go away on their own. Serious reactions are also possible but are rare. Most people who get Td vaccine do not have any problems with it. Mild Problems  following Td vaccine: (Did not interfere with activities)  Pain where the shot was given (about 8 people in 10)  Redness or swelling where the shot was given (about 1 person in 4)  Mild fever (rare)  Headache (about 1 person in 4)  Tiredness (about 1 person in 4) Moderate Problems following Td vaccine: (Interfered with activities, but did not require medical attention)  Fever over 102F (rare) Severe Problems  following Td vaccine: (Unable to perform usual activities; required medical attention)  Swelling, severe pain, bleeding and/or redness in the arm where the shot was given (rare). Problems that could happen after any vaccine:  People sometimes faint after a medical procedure, including vaccination. Sitting or lying down for about 15 minutes can help prevent fainting, and injuries caused by a fall. Tell your doctor if you feel dizzy, or have vision changes or ringing in the ears.    Some people get severe pain in the shoulder and have difficulty moving the arm where a shot was given. This happens very rarely.  Any medication can cause a severe allergic reaction. Such reactions from a vaccine are very rare, estimated at fewer than 1 in a million doses, and would happen within a few minutes to a few  hours after the vaccination. As with any medicine, there is a very remote chance of a vaccine causing a serious injury or death. The safety of vaccines is always being monitored. For more information, visit: http://floyd.org/www.cdc.gov/vaccinesafety/ 5. What if there is a serious reaction? What should I look for?  Look for anything that concerns you, such as signs of a severe allergic reaction, very high fever, or unusual behavior. Signs of a severe allergic reaction can include hives, swelling of the face and throat, difficulty breathing, a fast heartbeat, dizziness, and weakness. These would usually start a few minutes to a few hours after the vaccination. What should I do?  If you think it is a severe allergic reaction or other emergency that can't wait, call 9-1-1 or get the person to the nearest hospital. Otherwise, call your doctor.  Afterward, the reaction should be reported to the Vaccine Adverse Event Reporting System (VAERS). Your doctor might file this report, or you can do it yourself through the VAERS web site at www.vaers.LAgents.nohhs.gov, or by calling 1-7245217403. VAERS does not give medical advice. 6. The National Vaccine Injury Compensation Program The [employer] Vaccine Injury Compensation Program (VICP) is a federal program that was created to compensate people who may have been injured by certain vaccines. Persons who believe they may have been injured by a vaccine can learn about the program and about filing a claim by calling 1-585-526-2786 or visiting the VICP website at SpiritualWord.atwww.hrsa.gov/vaccinecompensation. There is a time limit to file a claim for compensation. 7. How can I learn more?  Ask your doctor. He or she can give you the vaccine package insert or suggest other sources of information.  Call your local or state health department.  Contact the Centers for Disease Control and Prevention (CDC):  Call (863)798-82011-917-409-3758 (1-800-CDC-INFO)  Visit CDC's website at PicCapture.uywww.cdc.gov/vaccines CDC Td  Vaccine VIS (05/28/13)   This information is not intended to replace advice given to you by your health care provider. Make sure you discuss any questions you have with your health care provider.   Document Released: 01/16/2006 Document Revised: 04/11/2014 Document Reviewed: 07/03/2013 Elsevier Interactive Patient Education 2016 [employer]    IF you received an x-ray today, you will receive an invoice from [facility]. Please contact [facility] at [phone] with questions or concerns regarding your invoice.   IF you received labwork today, you will receive an invoice from [facility]. Please contact [facility] at [phone] with questions or concerns regarding your invoice.   Our billing staff will not be able to assist you with questions regarding bills from these companies.  You will be contacted with the lab results as soon as they are available. The fastest way to get your results is to activate your My Chart account. Instructions are located on the last page of this paperwork. If you have not heard from us regarding the results in 2 weeks, please contact this office.

## 2015-07-30 NOTE — Progress Notes (Signed)
   Subjective:    Patient ID: Johnny Stephenson, male    DOB: [birth year], 35 y.o.   MRN: [id number]  HPI This is a pleasant 35 yo male who presents today requesting Tdap. His wife delivered their first child, a daughter, earlier this month and he was advised to update his Tdap. Does not remember last vaccine.   History reviewed. No pertinent past medical history. Past Surgical History  Procedure Laterality Date  . Wisdom tooth extraction    . Laparoscopic appendectomy N/A 12/10/2014    Procedure: APPENDECTOMY LAPAROSCOPIC;  Surgeon: Claud KelpHaywood Ingram, MD;  Location: [facility];  Service: General;  Laterality: N/A;   History reviewed. No pertinent family history. Social History  Substance Use Topics  . Smoking status: Never Smoker   . Smokeless tobacco: Never Used  . Alcohol Use: 0.0 oz/week    0 Standard drinks or equivalent per week     Comment: occassionally      Review of Systems     Objective:   Physical Exam  Physical Exam  Vitals reviewed. Constitutional: Oriented to person, place, and time. Appears well-developed and well-nourished.  HENT:  Head: Normocephalic and atraumatic.  Eyes: Conjunctivae are normal.  Neck: Normal range of motion. Neck supple.  Cardiovascular: Normal rate.   Pulmonary/Chest: Effort normal.  Musculoskeletal: Normal range of motion.  Neurological: Alert and oriented to person, place, and time.  Skin: Skin is warm and dry.  Psychiatric: Normal mood and affect. Behavior is normal. Judgment and thought content normal.     BP 138/80 mmHg  Pulse 77  Temp(Src) 97.5 F (36.4 C) (Oral)  Resp 16  Ht 5\' 8"  (1.727 m)  Wt 183 lb (83.008 kg)  BMI 27.83 kg/m2  SpO2 97%     Assessment & Plan:  1. Need for Tdap vaccination - Tdap vaccine greater than or equal to 7yo IM   Olean Reeeborah Gessner, FNP-BC  [facility]  07/30/2015 12:19 PM

## 2017-05-15 IMAGING — CT CT ABD-PELV W/ CM
2 of 4 series · 16 of 46 positions shown, 18 images · IV contrast (100 ML OMNI 300)
Comparison: None.

CLINICAL DATA: Right lower quadrant pain starting at 9866 hours
last night, increasing this morning. Elevated white cell count.

EXAM:
CT ABDOMEN AND PELVIS WITH CONTRAST
TECHNIQUE: Multidetector CT imaging of the abdomen and pelvis was performed
using the standard protocol following bolus administration of
intravenous contrast.
CONTRAST:  100mL OMNIPAQUE IOHEXOL 300 MG/ML  SOLN

[Series 2: abd/pel with · axial · 0.69mm/px · z∈[-532,-97]mm · 13 of 95 slices shown, 15 images]
[im 4/95  soft-tissue]
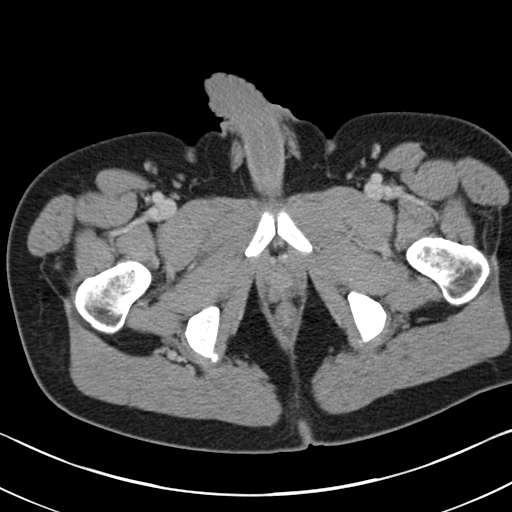
[im 4/95  bone]
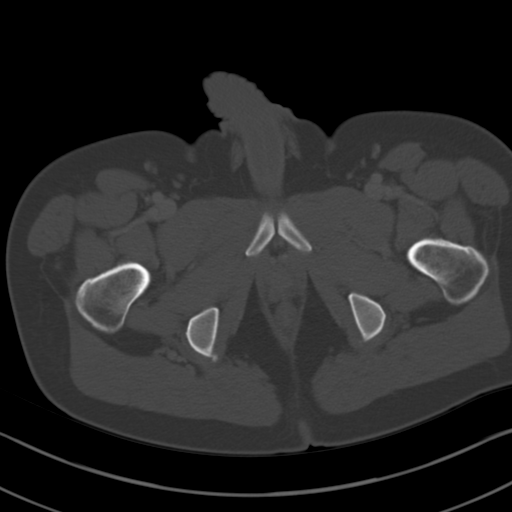
[im 12/95  soft-tissue]
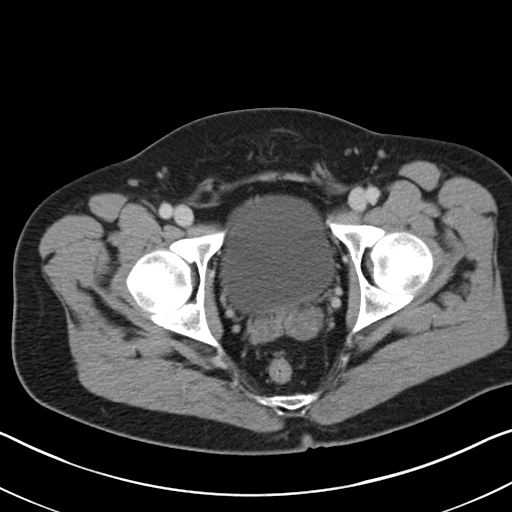
[im 20/95  soft-tissue]
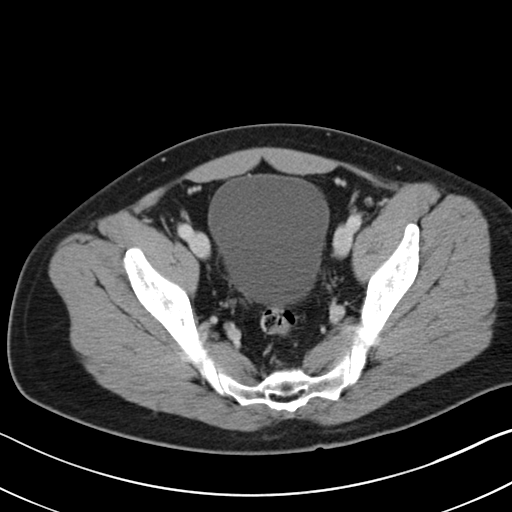
[im 28/95  soft-tissue]
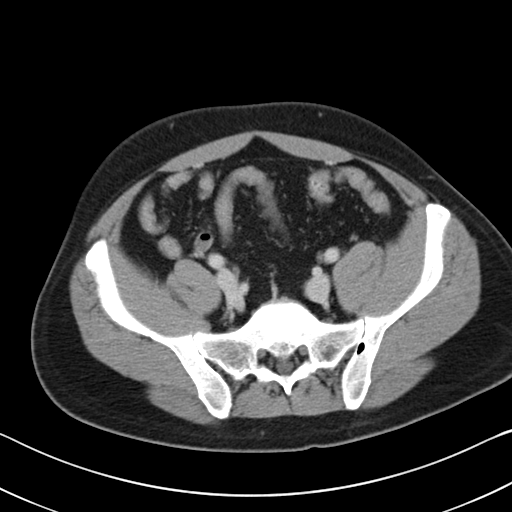
[im 32/95  soft-tissue]
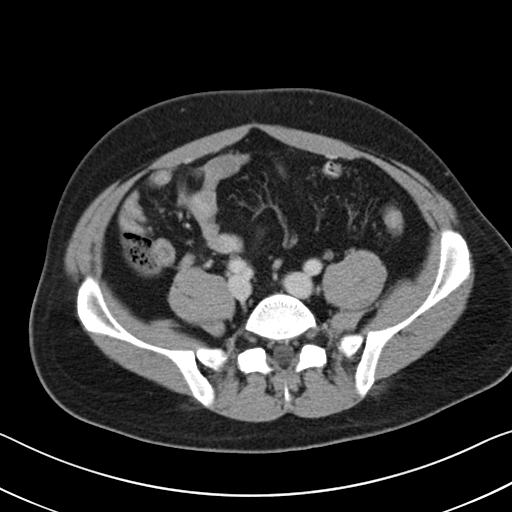
[im 40/95  soft-tissue]
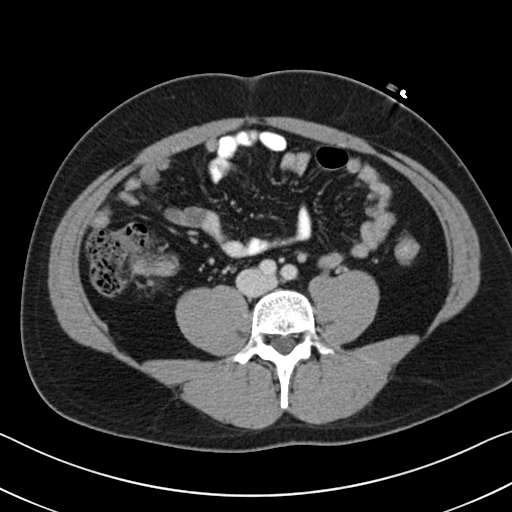
[im 48/95  soft-tissue]
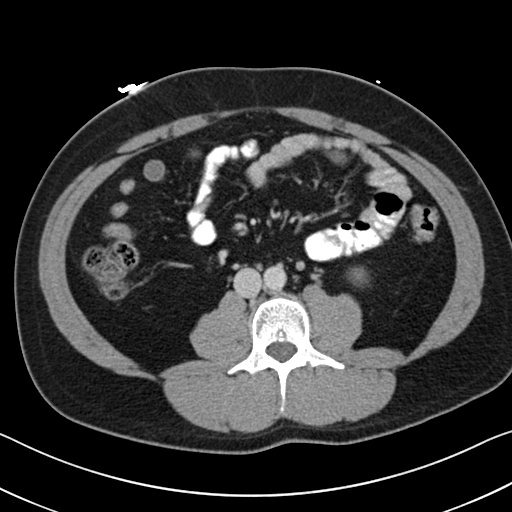
[im 55/95  soft-tissue]
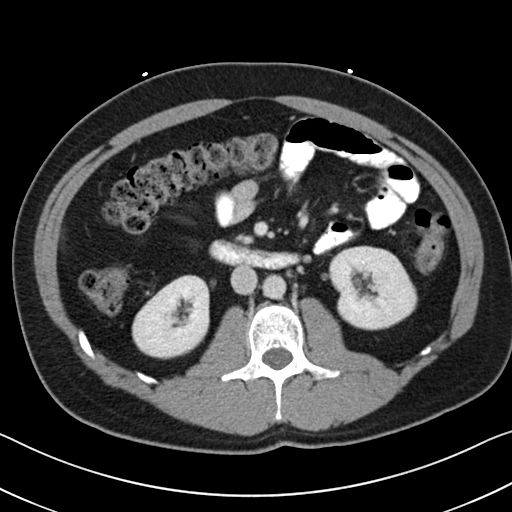
[im 63/95  soft-tissue]
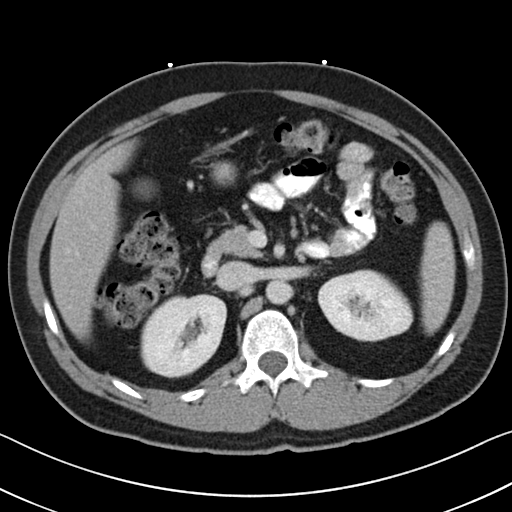
[im 63/95  bone]
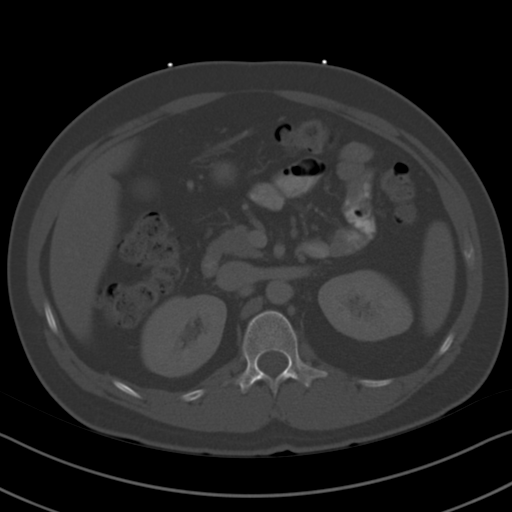
[im 67/95  soft-tissue]
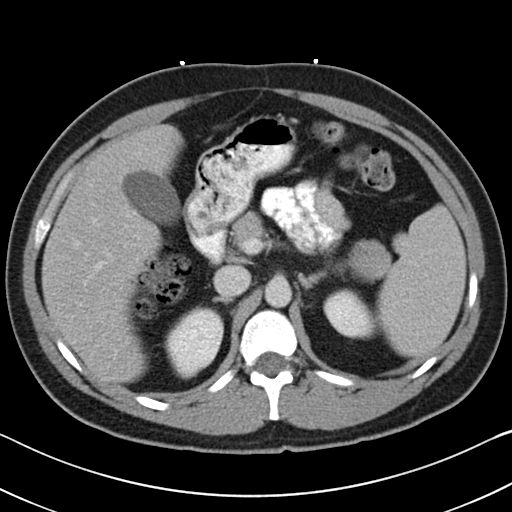
[im 75/95  soft-tissue]
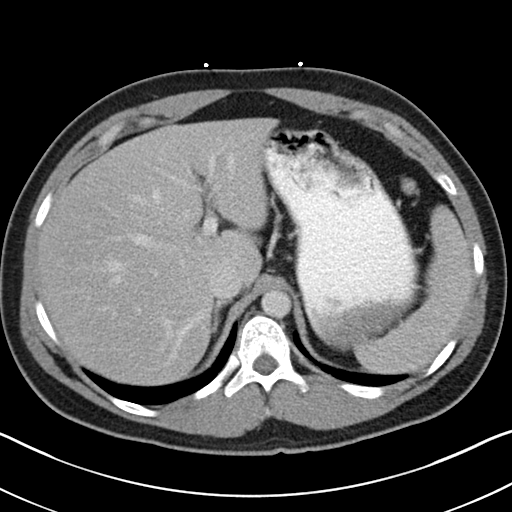
[im 83/95  soft-tissue]
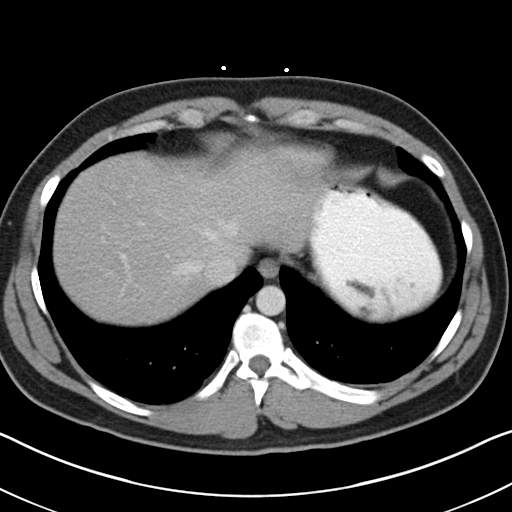
[im 91/95  soft-tissue]
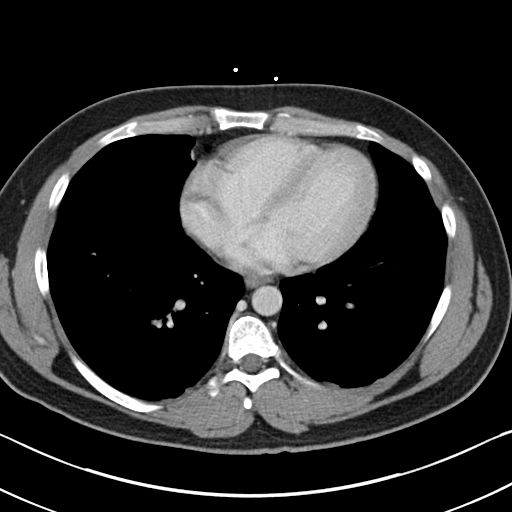

[Series 4: coronal a/|p · coronal · 0.65mm/px · 3 of 85 slices shown]
[im 29/85  soft-tissue]
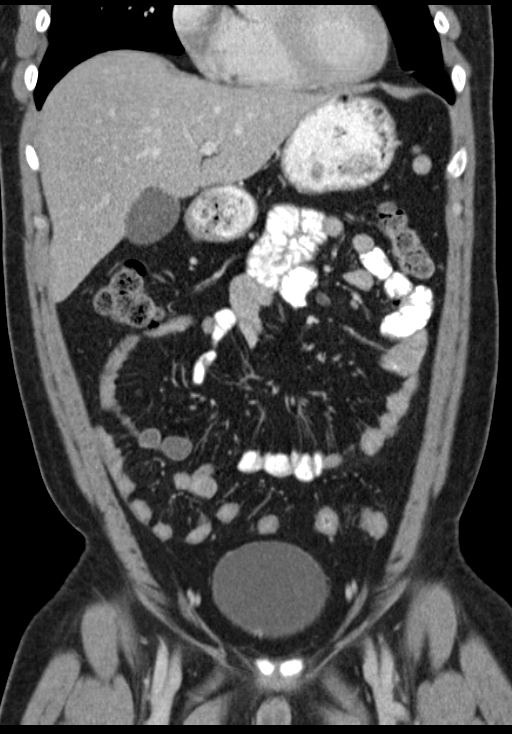
[im 38/85  soft-tissue]
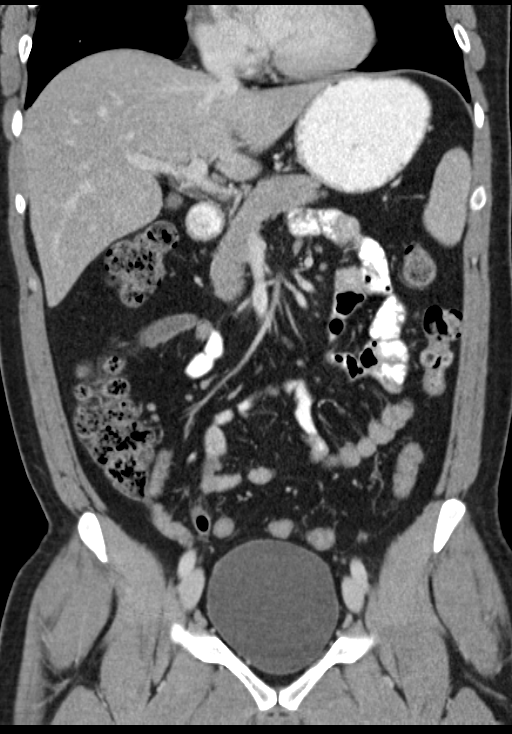
[im 47/85  soft-tissue]
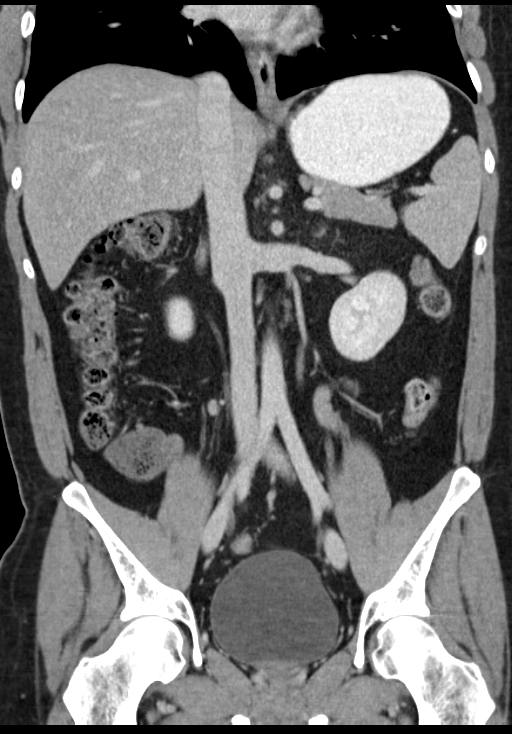

[16 of 46 positions shown; findings below may reference images not displayed]

FINDINGS: Lung bases are clear.

Focal fatty infiltration adjacent to the falciform ligament of the
liver. Gallbladder, spleen, pancreas, adrenal glands, kidneys,
abdominal aorta, inferior vena cava, and retroperitoneal lymph nodes
are unremarkable. Stomach, small bowel, and colon are not abnormally
distended. Stool fills the colon. No free air or free fluid in the
abdomen. Abdominal wall musculature appears intact.

Pelvis: Bulbous appendiceal tip measuring 10.6 mm diameter. There is
otherwise normal caliber to the appendix and there is gas in the
appendiceal tip. This suggest no evidence of acute appendicitis. No
inflammatory reaction around the appendix and no abscess
demonstrated. Calcifications in the prostate gland without prostate
enlargement. Bladder wall is not thickened. No free or loculated
pelvic fluid collections. No pelvic mass or lymphadenopathy. No
destructive bone lesions.
IMPRESSION: No acute process demonstrated in the abdomen or pelvis. No evidence
of significant appendicitis. Focal fatty infiltration of the liver.
